# Patient Record
Sex: Female | Born: 1975 | Hispanic: Yes | Marital: Married
Health system: Southern US, Community
[De-identification: ages and names within clinical notes are randomized; demographics above are authoritative.]

---

## 2014-11-13 ENCOUNTER — Ambulatory Visit: Payer: Self-pay

## 2015-12-26 DIAGNOSIS — J069 Acute upper respiratory infection, unspecified: Secondary | ICD-10-CM | POA: Diagnosis not present

## 2015-12-26 DIAGNOSIS — F411 Generalized anxiety disorder: Secondary | ICD-10-CM | POA: Diagnosis not present

## 2015-12-26 DIAGNOSIS — G43909 Migraine, unspecified, not intractable, without status migrainosus: Secondary | ICD-10-CM | POA: Diagnosis not present

## 2016-03-28 DIAGNOSIS — N898 Other specified noninflammatory disorders of vagina: Secondary | ICD-10-CM | POA: Diagnosis not present

## 2016-04-24 DIAGNOSIS — N898 Other specified noninflammatory disorders of vagina: Secondary | ICD-10-CM | POA: Diagnosis not present

## 2016-04-24 DIAGNOSIS — R1012 Left upper quadrant pain: Secondary | ICD-10-CM | POA: Diagnosis not present

## 2016-05-09 DIAGNOSIS — H40033 Anatomical narrow angle, bilateral: Secondary | ICD-10-CM | POA: Diagnosis not present

## 2016-05-09 DIAGNOSIS — H04123 Dry eye syndrome of bilateral lacrimal glands: Secondary | ICD-10-CM | POA: Diagnosis not present

## 2016-05-16 DIAGNOSIS — G44219 Episodic tension-type headache, not intractable: Secondary | ICD-10-CM | POA: Diagnosis not present

## 2016-07-04 DIAGNOSIS — N898 Other specified noninflammatory disorders of vagina: Secondary | ICD-10-CM | POA: Diagnosis not present

## 2016-07-04 DIAGNOSIS — Z01411 Encounter for gynecological examination (general) (routine) with abnormal findings: Secondary | ICD-10-CM | POA: Diagnosis not present

## 2016-07-04 DIAGNOSIS — Z6827 Body mass index (BMI) 27.0-27.9, adult: Secondary | ICD-10-CM | POA: Diagnosis not present

## 2016-07-05 DIAGNOSIS — Z01419 Encounter for gynecological examination (general) (routine) without abnormal findings: Secondary | ICD-10-CM | POA: Diagnosis not present

## 2016-07-22 DIAGNOSIS — N898 Other specified noninflammatory disorders of vagina: Secondary | ICD-10-CM | POA: Diagnosis not present

## 2016-07-29 DIAGNOSIS — R609 Edema, unspecified: Secondary | ICD-10-CM | POA: Diagnosis not present

## 2016-07-29 DIAGNOSIS — R600 Localized edema: Secondary | ICD-10-CM | POA: Diagnosis not present

## 2016-07-29 DIAGNOSIS — M7989 Other specified soft tissue disorders: Secondary | ICD-10-CM | POA: Diagnosis not present

## 2016-07-29 DIAGNOSIS — M79604 Pain in right leg: Secondary | ICD-10-CM | POA: Diagnosis not present

## 2016-07-29 DIAGNOSIS — M79605 Pain in left leg: Secondary | ICD-10-CM | POA: Diagnosis not present

## 2016-09-13 DIAGNOSIS — N760 Acute vaginitis: Secondary | ICD-10-CM | POA: Diagnosis not present

## 2016-09-14 DIAGNOSIS — Z113 Encounter for screening for infections with a predominantly sexual mode of transmission: Secondary | ICD-10-CM | POA: Diagnosis not present

## 2016-11-09 DIAGNOSIS — M79661 Pain in right lower leg: Secondary | ICD-10-CM | POA: Diagnosis not present

## 2016-11-09 DIAGNOSIS — I8390 Asymptomatic varicose veins of unspecified lower extremity: Secondary | ICD-10-CM | POA: Diagnosis not present

## 2016-11-09 DIAGNOSIS — R252 Cramp and spasm: Secondary | ICD-10-CM | POA: Diagnosis not present

## 2016-11-09 DIAGNOSIS — F411 Generalized anxiety disorder: Secondary | ICD-10-CM | POA: Diagnosis not present

## 2016-11-09 DIAGNOSIS — Z0000 Encounter for general adult medical examination without abnormal findings: Secondary | ICD-10-CM | POA: Diagnosis not present

## 2016-11-09 DIAGNOSIS — G43909 Migraine, unspecified, not intractable, without status migrainosus: Secondary | ICD-10-CM | POA: Diagnosis not present

## 2016-11-09 DIAGNOSIS — M79662 Pain in left lower leg: Secondary | ICD-10-CM | POA: Diagnosis not present

## 2016-12-03 DIAGNOSIS — N7689 Other specified inflammation of vagina and vulva: Secondary | ICD-10-CM | POA: Diagnosis not present

## 2016-12-03 DIAGNOSIS — M25572 Pain in left ankle and joints of left foot: Secondary | ICD-10-CM | POA: Diagnosis not present

## 2017-01-08 DIAGNOSIS — N859 Noninflammatory disorder of uterus, unspecified: Secondary | ICD-10-CM | POA: Diagnosis not present

## 2017-01-08 DIAGNOSIS — N920 Excessive and frequent menstruation with regular cycle: Secondary | ICD-10-CM | POA: Diagnosis not present

## 2017-01-08 DIAGNOSIS — N871 Moderate cervical dysplasia: Secondary | ICD-10-CM | POA: Diagnosis not present

## 2017-01-23 DIAGNOSIS — M79672 Pain in left foot: Secondary | ICD-10-CM | POA: Diagnosis not present

## 2017-01-23 DIAGNOSIS — G43909 Migraine, unspecified, not intractable, without status migrainosus: Secondary | ICD-10-CM | POA: Diagnosis not present

## 2017-01-23 DIAGNOSIS — F411 Generalized anxiety disorder: Secondary | ICD-10-CM | POA: Diagnosis not present

## 2017-05-29 DIAGNOSIS — B373 Candidiasis of vulva and vagina: Secondary | ICD-10-CM | POA: Diagnosis not present

## 2017-05-29 DIAGNOSIS — R300 Dysuria: Secondary | ICD-10-CM | POA: Diagnosis not present

## 2017-05-29 DIAGNOSIS — Z113 Encounter for screening for infections with a predominantly sexual mode of transmission: Secondary | ICD-10-CM | POA: Diagnosis not present

## 2017-05-29 DIAGNOSIS — N898 Other specified noninflammatory disorders of vagina: Secondary | ICD-10-CM | POA: Diagnosis not present

## 2017-05-31 ENCOUNTER — Other Ambulatory Visit: Payer: Self-pay | Admitting: Family Medicine

## 2017-05-31 DIAGNOSIS — G43909 Migraine, unspecified, not intractable, without status migrainosus: Secondary | ICD-10-CM | POA: Diagnosis not present

## 2017-05-31 DIAGNOSIS — Z1231 Encounter for screening mammogram for malignant neoplasm of breast: Secondary | ICD-10-CM

## 2017-05-31 DIAGNOSIS — L811 Chloasma: Secondary | ICD-10-CM | POA: Diagnosis not present

## 2017-05-31 DIAGNOSIS — F419 Anxiety disorder, unspecified: Secondary | ICD-10-CM | POA: Diagnosis not present

## 2017-05-31 DIAGNOSIS — Z6824 Body mass index (BMI) 24.0-24.9, adult: Secondary | ICD-10-CM | POA: Diagnosis not present

## 2017-06-25 ENCOUNTER — Ambulatory Visit: Payer: Self-pay

## 2017-08-06 DIAGNOSIS — H04123 Dry eye syndrome of bilateral lacrimal glands: Secondary | ICD-10-CM | POA: Diagnosis not present

## 2017-08-06 DIAGNOSIS — H40033 Anatomical narrow angle, bilateral: Secondary | ICD-10-CM | POA: Diagnosis not present

## 2018-01-17 DIAGNOSIS — G43909 Migraine, unspecified, not intractable, without status migrainosus: Secondary | ICD-10-CM | POA: Diagnosis not present

## 2018-01-17 DIAGNOSIS — J111 Influenza due to unidentified influenza virus with other respiratory manifestations: Secondary | ICD-10-CM | POA: Diagnosis not present

## 2018-01-17 DIAGNOSIS — F419 Anxiety disorder, unspecified: Secondary | ICD-10-CM | POA: Diagnosis not present

## 2018-01-17 DIAGNOSIS — J069 Acute upper respiratory infection, unspecified: Secondary | ICD-10-CM | POA: Diagnosis not present

## 2018-02-13 DIAGNOSIS — Z6827 Body mass index (BMI) 27.0-27.9, adult: Secondary | ICD-10-CM | POA: Diagnosis not present

## 2018-02-13 DIAGNOSIS — Z1231 Encounter for screening mammogram for malignant neoplasm of breast: Secondary | ICD-10-CM | POA: Diagnosis not present

## 2018-02-13 DIAGNOSIS — Z01419 Encounter for gynecological examination (general) (routine) without abnormal findings: Secondary | ICD-10-CM | POA: Diagnosis not present

## 2018-05-27 ENCOUNTER — Other Ambulatory Visit: Payer: Self-pay | Admitting: Family Medicine

## 2018-05-27 ENCOUNTER — Ambulatory Visit
Admission: RE | Admit: 2018-05-27 | Discharge: 2018-05-27 | Disposition: A | Discharge status: Home / Self Care | Payer: BLUE CROSS/BLUE SHIELD | Source: Ambulatory Visit | Attending: Family Medicine | Admitting: Family Medicine

## 2018-05-27 DIAGNOSIS — Z01818 Encounter for other preprocedural examination: Secondary | ICD-10-CM

## 2018-05-27 DIAGNOSIS — F419 Anxiety disorder, unspecified: Secondary | ICD-10-CM | POA: Diagnosis not present

## 2018-05-27 DIAGNOSIS — G43909 Migraine, unspecified, not intractable, without status migrainosus: Secondary | ICD-10-CM | POA: Diagnosis not present

## 2018-05-27 DIAGNOSIS — Z6824 Body mass index (BMI) 24.0-24.9, adult: Secondary | ICD-10-CM | POA: Diagnosis not present

## 2018-05-27 DIAGNOSIS — G4700 Insomnia, unspecified: Secondary | ICD-10-CM | POA: Diagnosis not present

## 2018-05-27 DIAGNOSIS — Z79899 Other long term (current) drug therapy: Secondary | ICD-10-CM | POA: Diagnosis not present

## 2018-06-10 DIAGNOSIS — Z01818 Encounter for other preprocedural examination: Secondary | ICD-10-CM | POA: Diagnosis not present

## 2018-06-10 DIAGNOSIS — I491 Atrial premature depolarization: Secondary | ICD-10-CM | POA: Diagnosis not present

## 2018-08-27 DIAGNOSIS — H16223 Keratoconjunctivitis sicca, not specified as Sjogren's, bilateral: Secondary | ICD-10-CM | POA: Diagnosis not present

## 2018-08-27 DIAGNOSIS — H40033 Anatomical narrow angle, bilateral: Secondary | ICD-10-CM | POA: Diagnosis not present

## 2018-11-04 DIAGNOSIS — Z1329 Encounter for screening for other suspected endocrine disorder: Secondary | ICD-10-CM | POA: Diagnosis not present

## 2018-11-04 DIAGNOSIS — F339 Major depressive disorder, recurrent, unspecified: Secondary | ICD-10-CM | POA: Diagnosis not present

## 2018-11-04 DIAGNOSIS — F419 Anxiety disorder, unspecified: Secondary | ICD-10-CM | POA: Diagnosis not present

## 2018-11-04 DIAGNOSIS — G43909 Migraine, unspecified, not intractable, without status migrainosus: Secondary | ICD-10-CM | POA: Diagnosis not present

## 2018-11-04 DIAGNOSIS — G4700 Insomnia, unspecified: Secondary | ICD-10-CM | POA: Diagnosis not present

## 2018-11-04 DIAGNOSIS — Z114 Encounter for screening for human immunodeficiency virus [HIV]: Secondary | ICD-10-CM | POA: Diagnosis not present

## 2018-11-04 DIAGNOSIS — Z13220 Encounter for screening for lipoid disorders: Secondary | ICD-10-CM | POA: Diagnosis not present

## 2018-11-04 DIAGNOSIS — Z0000 Encounter for general adult medical examination without abnormal findings: Secondary | ICD-10-CM | POA: Diagnosis not present

## 2018-11-05 DIAGNOSIS — F411 Generalized anxiety disorder: Secondary | ICD-10-CM | POA: Diagnosis not present

## 2018-11-06 DIAGNOSIS — Z0000 Encounter for general adult medical examination without abnormal findings: Secondary | ICD-10-CM | POA: Diagnosis not present

## 2018-11-06 DIAGNOSIS — Z6824 Body mass index (BMI) 24.0-24.9, adult: Secondary | ICD-10-CM | POA: Diagnosis not present

## 2018-11-10 DIAGNOSIS — F411 Generalized anxiety disorder: Secondary | ICD-10-CM | POA: Diagnosis not present

## 2019-01-06 DIAGNOSIS — G4700 Insomnia, unspecified: Secondary | ICD-10-CM | POA: Diagnosis not present

## 2019-01-06 DIAGNOSIS — G43909 Migraine, unspecified, not intractable, without status migrainosus: Secondary | ICD-10-CM | POA: Diagnosis not present

## 2019-01-06 DIAGNOSIS — F419 Anxiety disorder, unspecified: Secondary | ICD-10-CM | POA: Diagnosis not present

## 2019-01-06 DIAGNOSIS — R229 Localized swelling, mass and lump, unspecified: Secondary | ICD-10-CM | POA: Diagnosis not present

## 2019-01-16 DIAGNOSIS — N390 Urinary tract infection, site not specified: Secondary | ICD-10-CM | POA: Diagnosis not present

## 2019-01-27 DIAGNOSIS — N390 Urinary tract infection, site not specified: Secondary | ICD-10-CM | POA: Diagnosis not present

## 2019-02-17 DIAGNOSIS — R229 Localized swelling, mass and lump, unspecified: Secondary | ICD-10-CM | POA: Diagnosis not present

## 2019-02-17 DIAGNOSIS — F419 Anxiety disorder, unspecified: Secondary | ICD-10-CM | POA: Diagnosis not present

## 2019-02-17 DIAGNOSIS — F339 Major depressive disorder, recurrent, unspecified: Secondary | ICD-10-CM | POA: Diagnosis not present

## 2019-03-02 DIAGNOSIS — U071 COVID-19: Secondary | ICD-10-CM | POA: Diagnosis not present

## 2019-03-02 DIAGNOSIS — R509 Fever, unspecified: Secondary | ICD-10-CM | POA: Diagnosis not present

## 2019-03-03 ENCOUNTER — Other Ambulatory Visit: Payer: BLUE CROSS/BLUE SHIELD

## 2019-03-03 ENCOUNTER — Telehealth: Payer: Self-pay | Admitting: *Deleted

## 2019-03-03 DIAGNOSIS — Z20822 Contact with and (suspected) exposure to covid-19: Secondary | ICD-10-CM

## 2019-03-03 DIAGNOSIS — R6889 Other general symptoms and signs: Secondary | ICD-10-CM | POA: Diagnosis not present

## 2019-03-03 NOTE — Telephone Encounter (Signed)
Lattie Haw from Dr. Ival Bible office called to refer this patient for covid-19 testing. Pt notified and scheduled for today at 2 pm at the [facility]. Advised that this is a drive thru test site, so stay in car wear a mask with windows rolled up until time for testing. Pt voiced understanding.

## 2019-03-05 DIAGNOSIS — U071 COVID-19: Secondary | ICD-10-CM | POA: Diagnosis not present

## 2019-03-05 DIAGNOSIS — R509 Fever, unspecified: Secondary | ICD-10-CM | POA: Diagnosis not present

## 2019-03-06 DIAGNOSIS — N952 Postmenopausal atrophic vaginitis: Secondary | ICD-10-CM | POA: Diagnosis not present

## 2019-03-06 DIAGNOSIS — U071 COVID-19: Secondary | ICD-10-CM | POA: Diagnosis not present

## 2019-03-06 DIAGNOSIS — N390 Urinary tract infection, site not specified: Secondary | ICD-10-CM | POA: Diagnosis not present

## 2019-03-07 LAB — NOVEL CORONAVIRUS, NAA: SARS-CoV-2, NAA: DETECTED — AB

## 2019-03-12 DIAGNOSIS — R509 Fever, unspecified: Secondary | ICD-10-CM | POA: Diagnosis not present

## 2019-03-12 DIAGNOSIS — J309 Allergic rhinitis, unspecified: Secondary | ICD-10-CM | POA: Diagnosis not present

## 2019-03-12 DIAGNOSIS — R51 Headache: Secondary | ICD-10-CM | POA: Diagnosis not present

## 2019-03-12 DIAGNOSIS — U071 COVID-19: Secondary | ICD-10-CM | POA: Diagnosis not present

## 2019-05-28 DIAGNOSIS — Z6829 Body mass index (BMI) 29.0-29.9, adult: Secondary | ICD-10-CM | POA: Diagnosis not present

## 2019-05-28 DIAGNOSIS — Z1231 Encounter for screening mammogram for malignant neoplasm of breast: Secondary | ICD-10-CM | POA: Diagnosis not present

## 2019-05-28 DIAGNOSIS — Z01419 Encounter for gynecological examination (general) (routine) without abnormal findings: Secondary | ICD-10-CM | POA: Diagnosis not present

## 2019-08-25 DIAGNOSIS — N898 Other specified noninflammatory disorders of vagina: Secondary | ICD-10-CM | POA: Diagnosis not present

## 2019-08-25 DIAGNOSIS — Z113 Encounter for screening for infections with a predominantly sexual mode of transmission: Secondary | ICD-10-CM | POA: Diagnosis not present

## 2019-08-28 DIAGNOSIS — N760 Acute vaginitis: Secondary | ICD-10-CM | POA: Diagnosis not present

## 2019-08-28 DIAGNOSIS — G43909 Migraine, unspecified, not intractable, without status migrainosus: Secondary | ICD-10-CM | POA: Diagnosis not present

## 2019-08-28 DIAGNOSIS — R442 Other hallucinations: Secondary | ICD-10-CM | POA: Diagnosis not present

## 2019-08-28 DIAGNOSIS — N943 Premenstrual tension syndrome: Secondary | ICD-10-CM | POA: Diagnosis not present

## 2019-11-09 DIAGNOSIS — Z0000 Encounter for general adult medical examination without abnormal findings: Secondary | ICD-10-CM | POA: Diagnosis not present

## 2019-11-09 DIAGNOSIS — Z0184 Encounter for antibody response examination: Secondary | ICD-10-CM | POA: Diagnosis not present

## 2019-11-12 DIAGNOSIS — Z20828 Contact with and (suspected) exposure to other viral communicable diseases: Secondary | ICD-10-CM | POA: Diagnosis not present

## 2019-12-17 DIAGNOSIS — Z20828 Contact with and (suspected) exposure to other viral communicable diseases: Secondary | ICD-10-CM | POA: Diagnosis not present

## 2019-12-22 DIAGNOSIS — Z6823 Body mass index (BMI) 23.0-23.9, adult: Secondary | ICD-10-CM | POA: Diagnosis not present

## 2019-12-22 DIAGNOSIS — Z0000 Encounter for general adult medical examination without abnormal findings: Secondary | ICD-10-CM | POA: Diagnosis not present

## 2020-05-19 DIAGNOSIS — Z20828 Contact with and (suspected) exposure to other viral communicable diseases: Secondary | ICD-10-CM | POA: Diagnosis not present

## 2020-06-06 DIAGNOSIS — Z3202 Encounter for pregnancy test, result negative: Secondary | ICD-10-CM | POA: Diagnosis not present

## 2020-06-06 DIAGNOSIS — N930 Postcoital and contact bleeding: Secondary | ICD-10-CM | POA: Diagnosis not present

## 2020-06-06 DIAGNOSIS — Z113 Encounter for screening for infections with a predominantly sexual mode of transmission: Secondary | ICD-10-CM | POA: Diagnosis not present

## 2020-06-20 DIAGNOSIS — F411 Generalized anxiety disorder: Secondary | ICD-10-CM | POA: Diagnosis not present

## 2020-06-20 DIAGNOSIS — R519 Headache, unspecified: Secondary | ICD-10-CM | POA: Diagnosis not present

## 2020-06-20 DIAGNOSIS — G4700 Insomnia, unspecified: Secondary | ICD-10-CM | POA: Diagnosis not present

## 2020-06-20 DIAGNOSIS — F332 Major depressive disorder, recurrent severe without psychotic features: Secondary | ICD-10-CM | POA: Diagnosis not present

## 2020-07-11 DIAGNOSIS — R0789 Other chest pain: Secondary | ICD-10-CM | POA: Diagnosis not present

## 2020-07-11 DIAGNOSIS — N6489 Other specified disorders of breast: Secondary | ICD-10-CM | POA: Diagnosis not present

## 2020-07-22 IMAGING — CR DG CHEST 2V
2 series · 2 of 2 positions shown · non-contrast
Comparison: None

CLINICAL DATA: Preoperative examination prior to breast
augmentation surgery. Nonsmoker. No current chest complaints.

EXAM:
CHEST - 2 VIEW

[w chest pa]
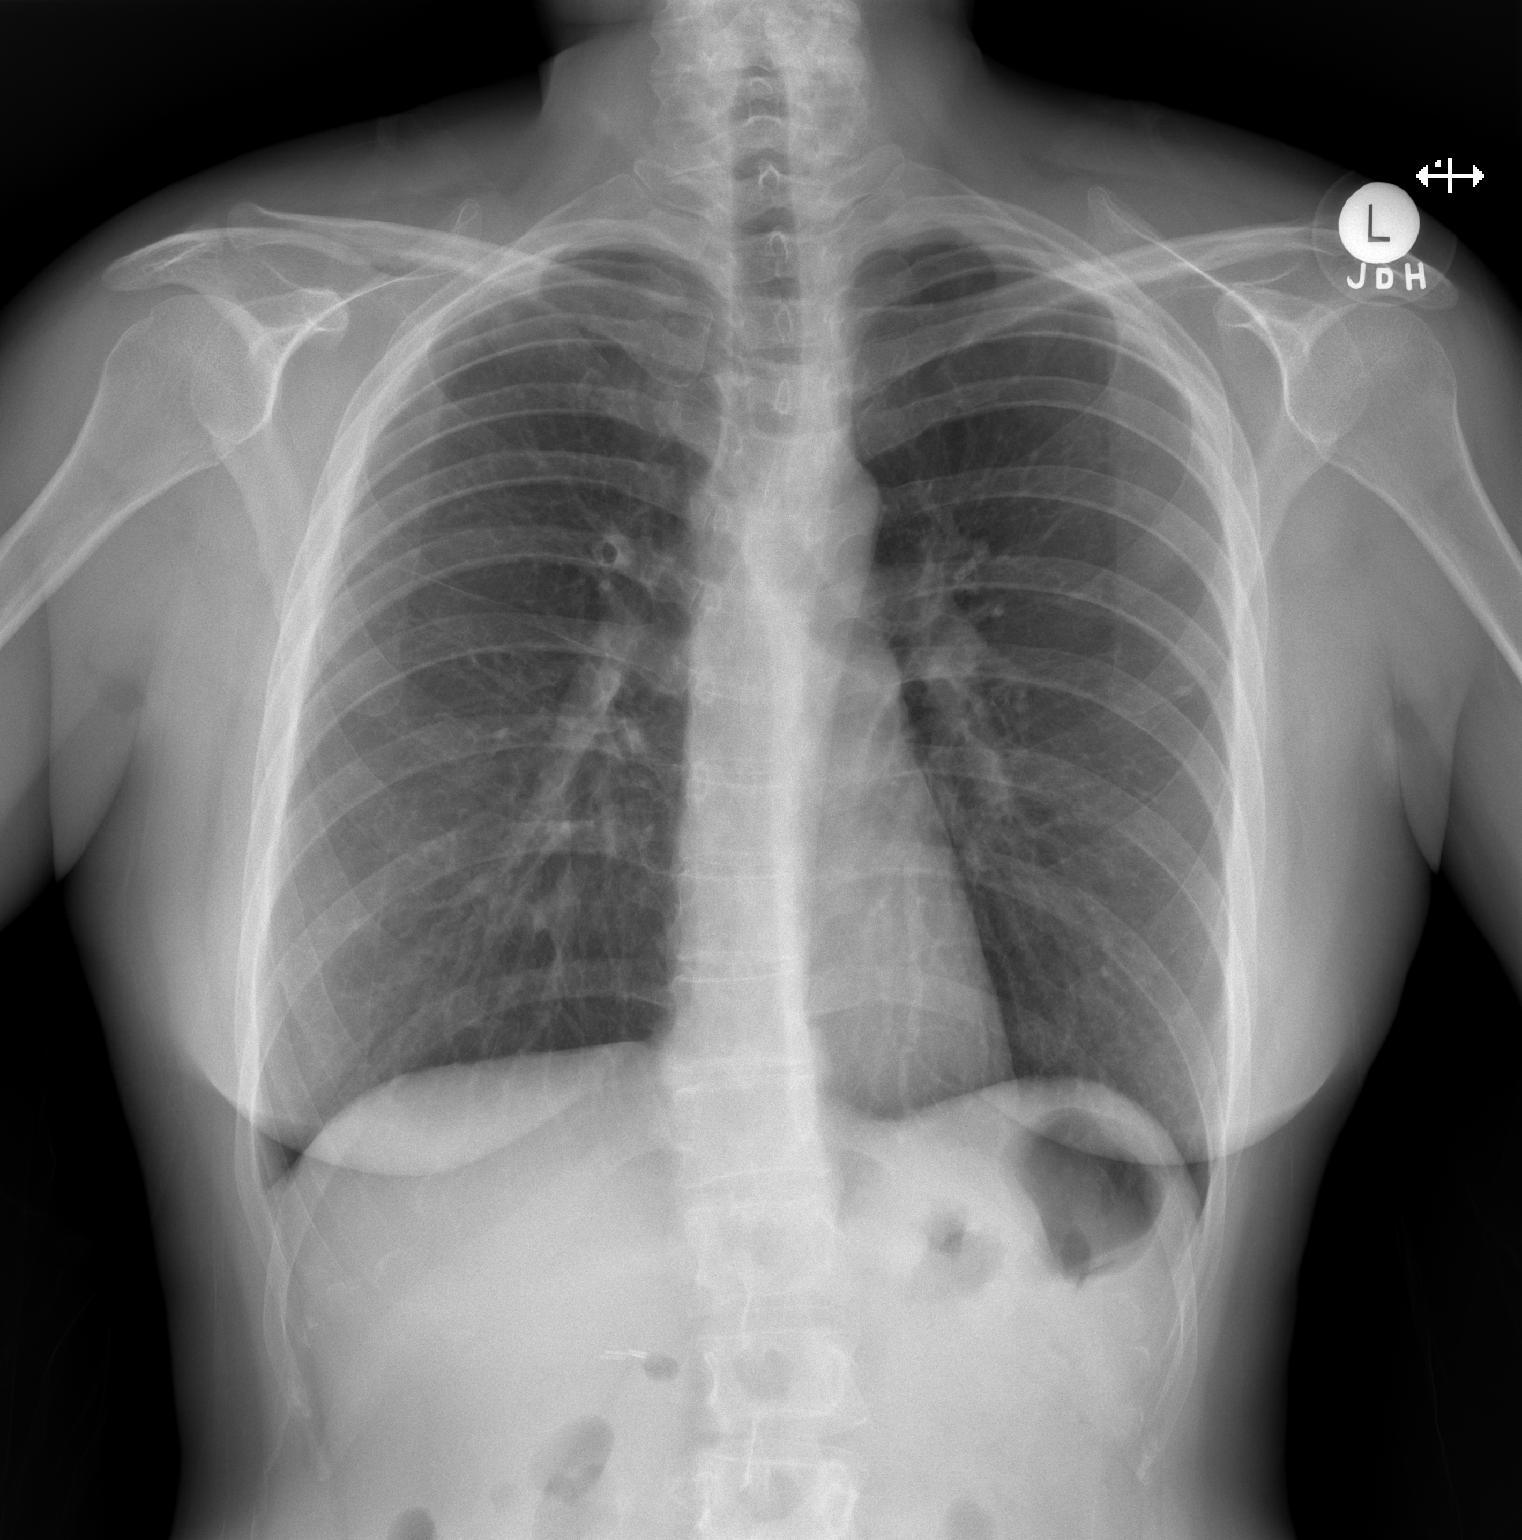

[w chest lat]
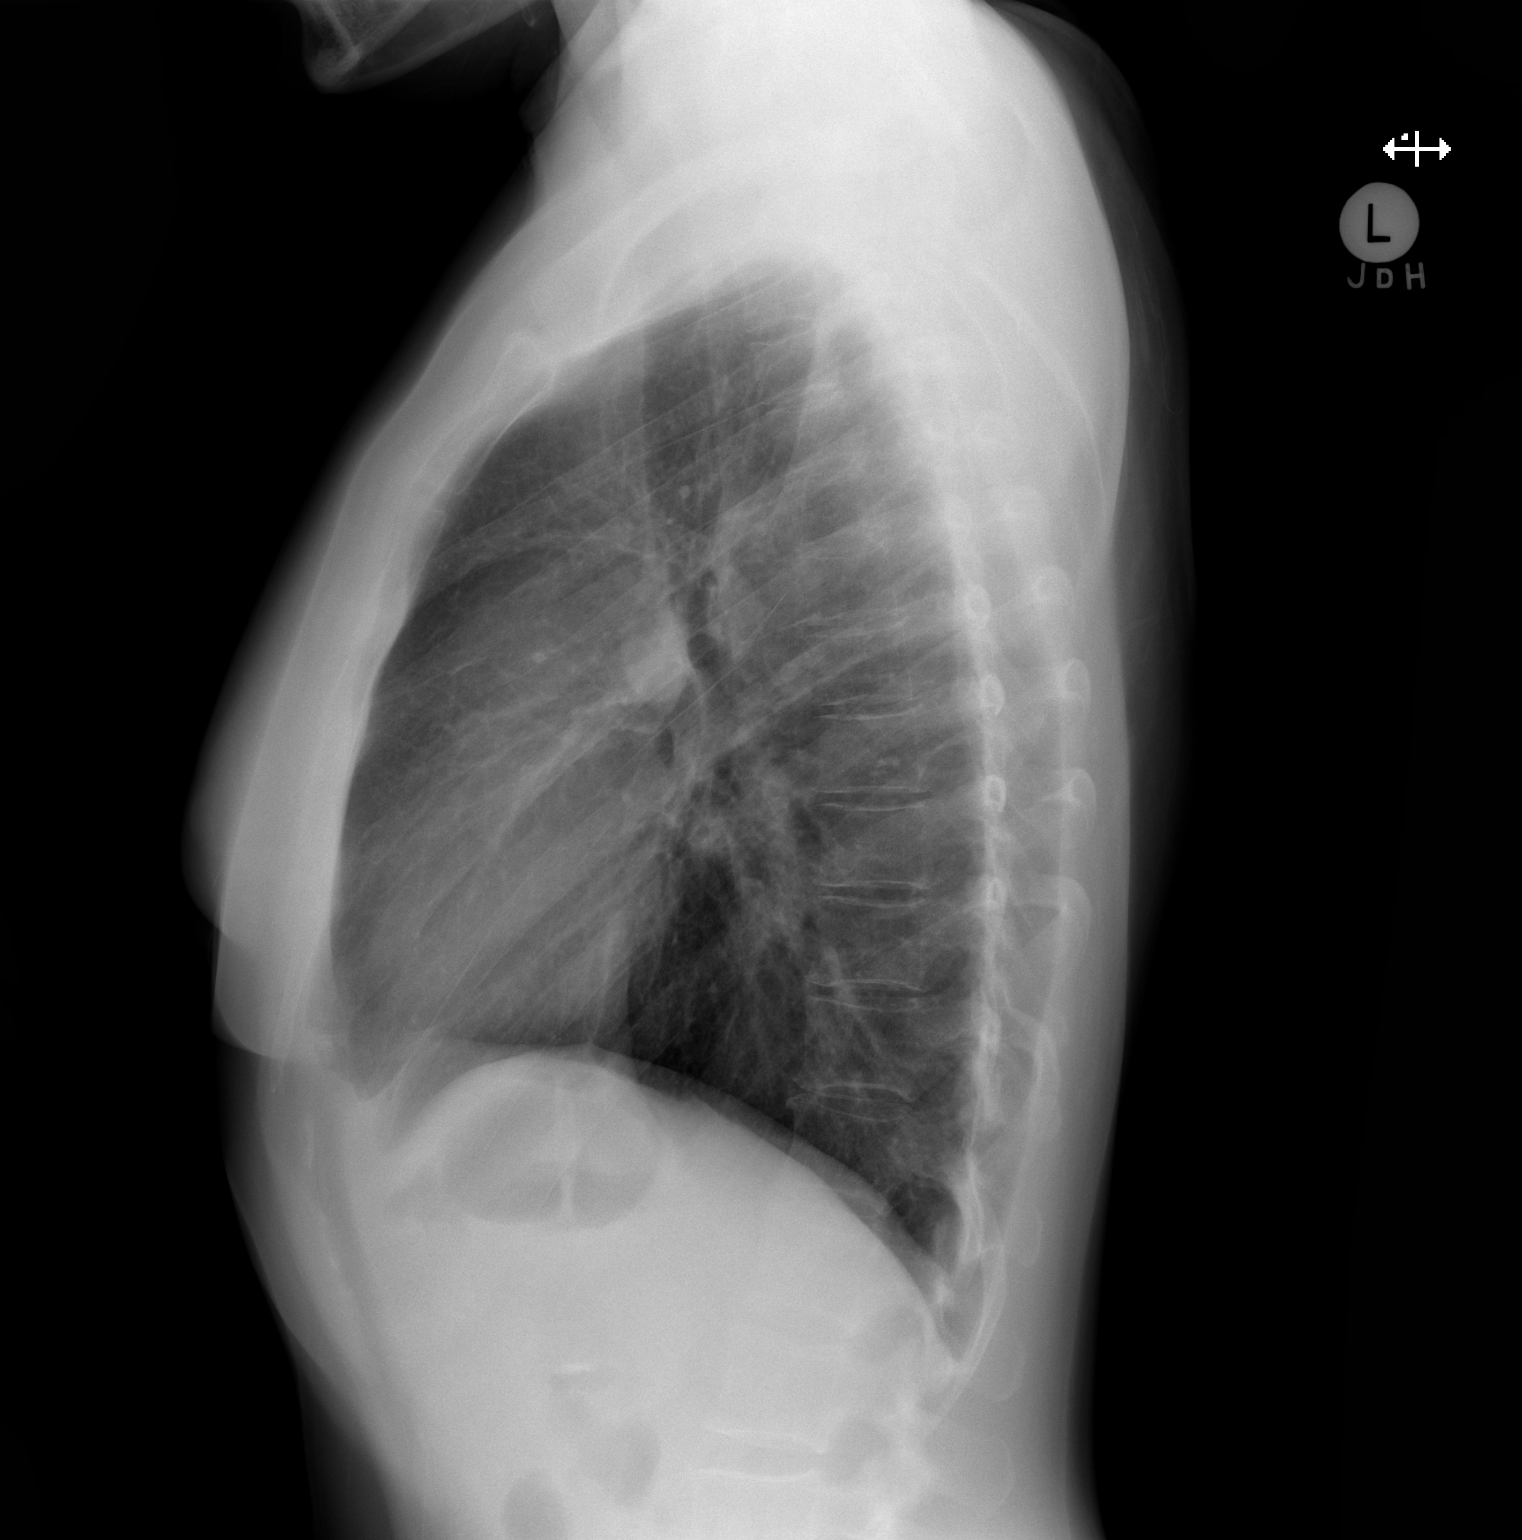

[2 of 2 positions shown; findings below may reference images not displayed]

FINDINGS: :
The lungs are well-expanded and clear. The heart and pulmonary
vascularity are normal. The mediastinum is normal in width. There is
an approximately 3 x 2 mm calcified nodule peripherally in the left
mid lung. There is gentle dextrocurvature centered in the mid to
lower thoracic spine.
IMPRESSION: There is no active cardiopulmonary disease. Probable previous
granulomatous infection.

## 2020-08-03 DIAGNOSIS — G43909 Migraine, unspecified, not intractable, without status migrainosus: Secondary | ICD-10-CM | POA: Diagnosis not present

## 2020-08-03 DIAGNOSIS — F411 Generalized anxiety disorder: Secondary | ICD-10-CM | POA: Diagnosis not present

## 2020-08-03 DIAGNOSIS — F331 Major depressive disorder, recurrent, moderate: Secondary | ICD-10-CM | POA: Diagnosis not present

## 2020-08-03 DIAGNOSIS — G4700 Insomnia, unspecified: Secondary | ICD-10-CM | POA: Diagnosis not present

## 2020-08-30 DIAGNOSIS — Z113 Encounter for screening for infections with a predominantly sexual mode of transmission: Secondary | ICD-10-CM | POA: Diagnosis not present

## 2020-08-30 DIAGNOSIS — Z3202 Encounter for pregnancy test, result negative: Secondary | ICD-10-CM | POA: Diagnosis not present

## 2020-08-30 DIAGNOSIS — N938 Other specified abnormal uterine and vaginal bleeding: Secondary | ICD-10-CM | POA: Diagnosis not present

## 2020-08-30 DIAGNOSIS — N898 Other specified noninflammatory disorders of vagina: Secondary | ICD-10-CM | POA: Diagnosis not present

## 2020-09-19 DIAGNOSIS — N762 Acute vulvitis: Secondary | ICD-10-CM | POA: Diagnosis not present

## 2020-09-19 DIAGNOSIS — R309 Painful micturition, unspecified: Secondary | ICD-10-CM | POA: Diagnosis not present

## 2020-09-19 DIAGNOSIS — R300 Dysuria: Secondary | ICD-10-CM | POA: Diagnosis not present

## 2020-09-23 DIAGNOSIS — N3000 Acute cystitis without hematuria: Secondary | ICD-10-CM | POA: Diagnosis not present

## 2021-05-31 DIAGNOSIS — Z13220 Encounter for screening for lipoid disorders: Secondary | ICD-10-CM | POA: Diagnosis not present

## 2021-05-31 DIAGNOSIS — Z1231 Encounter for screening mammogram for malignant neoplasm of breast: Secondary | ICD-10-CM | POA: Diagnosis not present

## 2021-05-31 DIAGNOSIS — Z01419 Encounter for gynecological examination (general) (routine) without abnormal findings: Secondary | ICD-10-CM | POA: Diagnosis not present

## 2021-05-31 DIAGNOSIS — Z0000 Encounter for general adult medical examination without abnormal findings: Secondary | ICD-10-CM | POA: Diagnosis not present

## 2021-05-31 DIAGNOSIS — Z6828 Body mass index (BMI) 28.0-28.9, adult: Secondary | ICD-10-CM | POA: Diagnosis not present

## 2021-06-09 DIAGNOSIS — G43009 Migraine without aura, not intractable, without status migrainosus: Secondary | ICD-10-CM | POA: Diagnosis not present

## 2021-06-09 DIAGNOSIS — Z6841 Body Mass Index (BMI) 40.0 and over, adult: Secondary | ICD-10-CM | POA: Diagnosis not present

## 2021-06-09 DIAGNOSIS — R635 Abnormal weight gain: Secondary | ICD-10-CM | POA: Diagnosis not present

## 2021-06-09 DIAGNOSIS — E785 Hyperlipidemia, unspecified: Secondary | ICD-10-CM | POA: Diagnosis not present

## 2021-06-09 DIAGNOSIS — D649 Anemia, unspecified: Secondary | ICD-10-CM | POA: Diagnosis not present
